# Patient Record
Sex: Female | Born: 1990 | Race: White | Hispanic: No | Marital: Single | Smoking: Never smoker
Health system: Southern US, Community
[De-identification: ages and names within clinical notes are randomized; demographics above are authoritative.]

## PROBLEM LIST (undated history)

## (undated) DIAGNOSIS — F419 Anxiety disorder, unspecified: Secondary | ICD-10-CM

---

## 2018-03-09 ENCOUNTER — Encounter: Payer: Self-pay | Admitting: Emergency Medicine

## 2018-03-09 ENCOUNTER — Emergency Department
Admission: EM | Admit: 2018-03-09 | Discharge: 2018-03-09 | Disposition: A | Discharge status: Home / Self Care | Payer: No Typology Code available for payment source | Attending: Emergency Medicine | Admitting: Emergency Medicine

## 2018-03-09 ENCOUNTER — Emergency Department: Payer: No Typology Code available for payment source

## 2018-03-09 ENCOUNTER — Other Ambulatory Visit: Payer: Self-pay

## 2018-03-09 DIAGNOSIS — S63501A Unspecified sprain of right wrist, initial encounter: Secondary | ICD-10-CM | POA: Insufficient documentation

## 2018-03-09 DIAGNOSIS — Y990 Civilian activity done for income or pay: Secondary | ICD-10-CM | POA: Diagnosis not present

## 2018-03-09 DIAGNOSIS — Y9389 Activity, other specified: Secondary | ICD-10-CM | POA: Insufficient documentation

## 2018-03-09 DIAGNOSIS — X500XXA Overexertion from strenuous movement or load, initial encounter: Secondary | ICD-10-CM | POA: Insufficient documentation

## 2018-03-09 DIAGNOSIS — Y929 Unspecified place or not applicable: Secondary | ICD-10-CM | POA: Insufficient documentation

## 2018-03-09 DIAGNOSIS — S6991XA Unspecified injury of right wrist, hand and finger(s), initial encounter: Secondary | ICD-10-CM | POA: Diagnosis present

## 2018-03-09 HISTORY — DX: Anxiety disorder, unspecified: F41.9

## 2018-03-09 MED ORDER — MELOXICAM 15 MG PO TABS: 15.0000 mg | ORAL_TABLET | Freq: Every day | ORAL | 2 refills | Status: AC

## 2018-03-09 NOTE — ED Notes (Signed)
I was placing the wrist splint on the patient and I asked if this would be a workman's comp and she stated "yes unfortunately." I went to call her supervisor to see what test needed to be done. When I returned to the room to let her know I didn't get in touch with anyone, she had already left.

## 2018-03-09 NOTE — Discharge Instructions (Signed)
Follow-up with orthopedics in 5 to 7 days.  Please discuss this with your Worker's Comp. to make sure you are seeing a orthopedic of their choice.  Otherwise she will have to pay out of pocket for your orthopedic visit.  Take the medication for pain and inflammation.  Keep the arm elevated and apply ice.

## 2018-03-09 NOTE — ED Notes (Signed)
Works for [employer].

## 2018-03-09 NOTE — ED Triage Notes (Signed)
Was lifting stair chair, felt a "pop" in right wrist this am, workers comp case.

## 2018-03-09 NOTE — ED Notes (Addendum)
See triage note  Presents with pain to right wrist/forearm  States felt the pain when lifting a stair chair  Good pulses  States this is not a wc case at this time

## 2018-03-09 NOTE — ED Provider Notes (Signed)
[facility] Emergency Department Provider Note  ____________________________________________   First MD Initiated Contact with Patient 03/09/18 1219     (approximate)  I have reviewed the triage vital signs and the nursing notes.   HISTORY  Chief Complaint Wrist Pain    HPI Kristi Norris is a 27 y.o. female presents to the emergency department complaining of right wrist and forearm pain.   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  When she lifted the chair she felt a pop and snap in her wrist.  She states she has to lift the chair underhanded.  She denies any numbness or tingling.  States she has pain with movement.  Is never had an issue with her wrist before.   Past Medical History:  Diagnosis Date  . Anxiety     There are no active problems to display for this patient.   History reviewed. No pertinent surgical history.  Prior to Admission medications   Medication Sig Start Date End Date Taking? Authorizing Provider  meloxicam (MOBIC) 15 MG tablet Take 1 tablet (15 mg total) by mouth daily. 03/09/18 03/09/19  Faythe Ghee, PA-C    Allergies Patient has no known allergies.  No family history on file.  Social History Social History   Tobacco Use  . Smoking status: Never Smoker  . Smokeless tobacco: Never Used  Substance Use Topics  . Alcohol use: Yes    Comment: occas.   . Drug use: Not on file    Review of Systems  Constitutional: No fever/chills Eyes: No visual changes. ENT: No sore throat. Respiratory: Denies cough Genitourinary: Negative for dysuria. Musculoskeletal: Negative for back pain.  Positive for right wrist pain Skin: Negative for rash.    ____________________________________________   PHYSICAL EXAM:  VITAL SIGNS: ED Triage Vitals  Enc Vitals Group     BP 03/09/18 1158 120/81     Pulse Rate 03/09/18 1158 87     Resp 03/09/18 1158 16     Temp 03/09/18 1158 98.3 F (36.8 C)   Temp Source 03/09/18 1158 Oral     SpO2 03/09/18 1158 96 %     Weight 03/09/18 1159 140 lb (63.5 kg)     Height 03/09/18 1159 5\' 2"  (1.575 m)     Head Circumference --      Peak Flow --      Pain Score 03/09/18 1159 8     Pain Loc --      Pain Edu? --      Excl. in GC? --     Constitutional: Alert and oriented. Well appearing and in no acute distress. Eyes: Conjunctivae are normal.  Head: Atraumatic. Nose: No congestion/rhinnorhea. Mouth/Throat: Mucous membranes are moist.   Neck:  supple no lymphadenopathy noted Cardiovascular: Normal rate, regular rhythm.  Respiratory: Normal respiratory effort.  No retractions, GU: deferred Musculoskeletal: FROM all extremities, warm and well perfused.  The right wrist and forearm are tender to palpation.  Pain is reproduced with dorsiflexion of the wrist.  Neurovascular is intact. Neurologic:  Normal speech and language.  Skin:  Skin is warm, dry and intact. No rash noted. Psychiatric: Mood and affect are normal. Speech and behavior are normal.  ____________________________________________   LABS (all labs ordered are listed, but only abnormal results are displayed)  Labs Reviewed - No data to display ____________________________________________   ____________________________________________  RADIOLOGY  X-ray of the right forearm is negative  ____________________________________________   PROCEDURES  Procedure(s)  performed: Velcro cock-up splint was applied by the tech  Procedures    ____________________________________________   INITIAL IMPRESSION / ASSESSMENT AND PLAN / ED COURSE  Pertinent labs & imaging results that were available during my care of the patient were reviewed by me and considered in my medical decision making (see chart for details).   Patient is [age] year old female presents emergency department complaining of right wrist pain after using a chairlift.  States she felt a pop and snap.  She states she has  pain with movement.  She denies any numbness or tingling.  On physical exam the patient is tender along the right forearm and wrist.  She has reproduce pain with dorsiflexion of the right wrist.  She is neurovascularly intact.  X-ray of the right forearm is negative for any acute abnormality.  Discussed all the findings with patient.  She is placed in a Velcro cock-up splint.  She is to follow-up with orthopedics for recheck prior to returning to work.  She is to apply ice and take meloxicam daily for pain and inflammation.  Explained to her that since this is Worker's Comp. she would be given light duty as he not using the right arm until evaluated by Ortho.  She is to follow-up with her supervisor and insurance carrier to determine which orthopedic office they will approve.  She states she understands will comply.  A work note was filled out for her to take to her supervisor.  She was discharged in stable condition.     As part of my medical decision making, I reviewed the following data within the electronic MEDICAL RECORD NUMBER Nursing notes reviewed and incorporated, Old chart reviewed, Radiograph reviewed x-ray of the right forearm is negative, Notes from prior ED visits and [place] Controlled Substance Database  ____________________________________________   FINAL CLINICAL IMPRESSION(S) / ED DIAGNOSES  Final diagnoses:  Sprain of right wrist, initial encounter      NEW MEDICATIONS STARTED DURING THIS VISIT:  Discharge Medication List as of 03/09/2018  1:01 PM    START taking these medications   Details  meloxicam (MOBIC) 15 MG tablet Take 1 tablet (15 mg total) by mouth daily., Starting Sun 03/09/2018, Until Mon 03/09/2019, Normal         Note:  This document was prepared using Dragon voice recognition software and may include unintentional dictation errors.    Faythe GheeFisher, Jhane Lorio W, PA-C 03/09/18 1529    Jene EveryKinner, Robert, MD 03/10/18 [phone]

## 2019-11-23 IMAGING — DX DG FOREARM 2V*R*
2 series · 2 of 2 positions shown · non-contrast
Comparison: None.

CLINICAL DATA: PT WAS LIFTING SOMETHING AT WORK TODAY AND FELT AND
HEARD A POP. RIGHT WRIST PAIN THAT GOES UP FOREARM. HX OF RIGHT
WRIST BREAK. UNDERWENT CONSV. TREATMENT FOR THAT. NO SX. ULNAR SIDE
WRIST PAIN. DENIES NUMBNESS AND TINGLING. DULL PAIN IN WRIST.
BURNING PAIN WITH DIRECT TOUCH.

EXAM:
RIGHT FOREARM - 2 VIEW

[forearm ap]
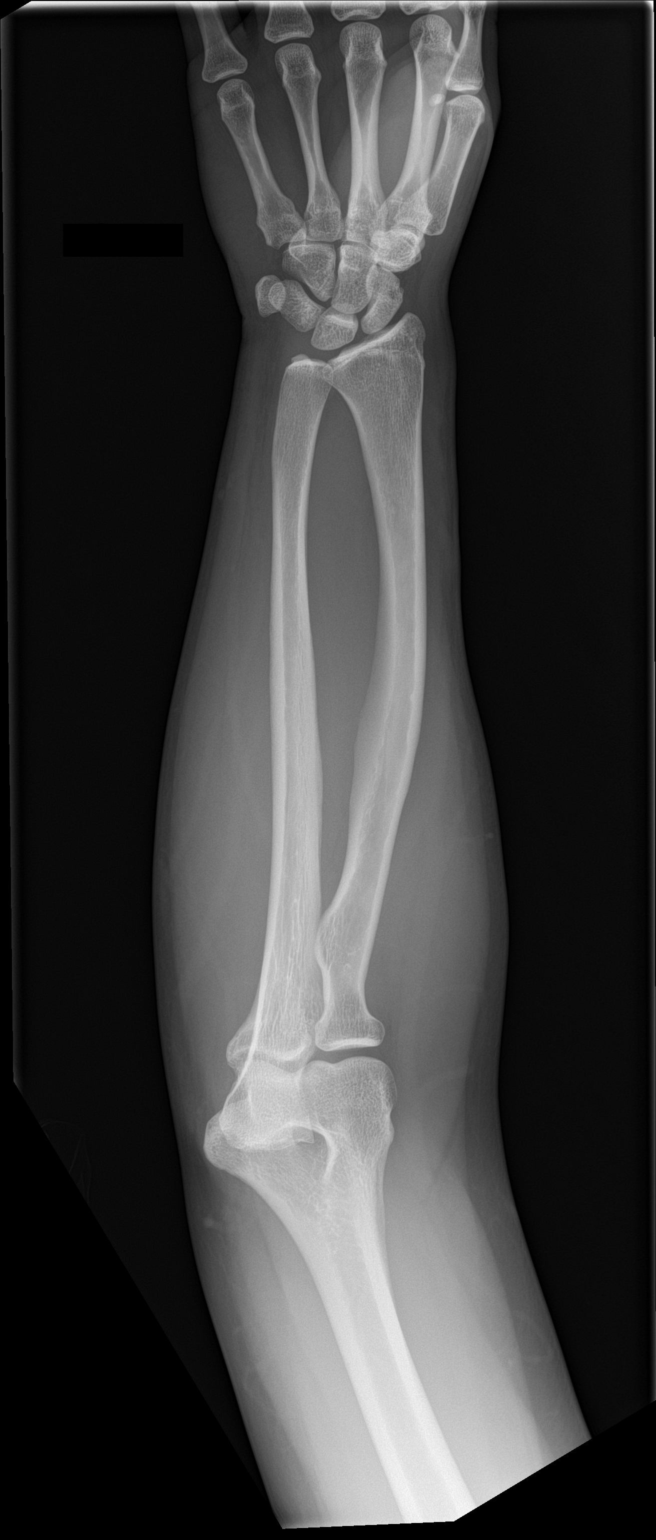

[forearm lat]
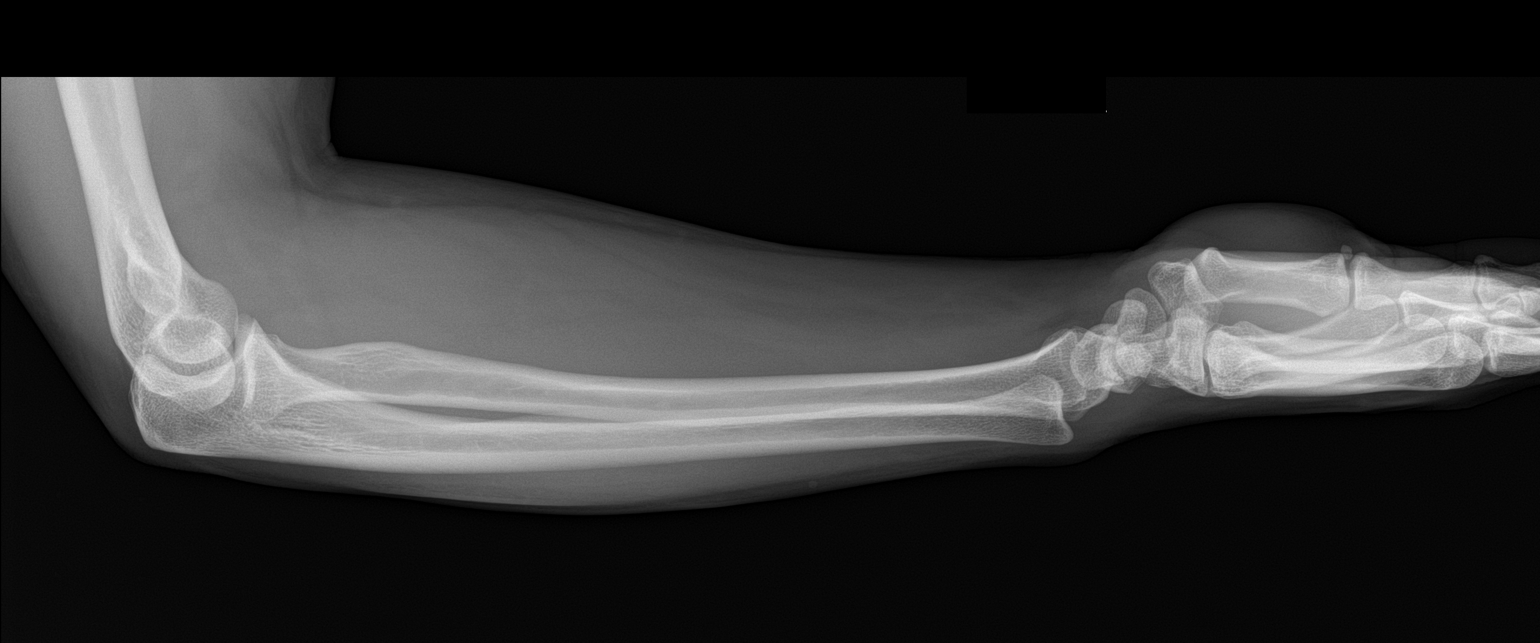

[2 of 2 positions shown; findings below may reference images not displayed]

FINDINGS: There is no evidence of fracture or other focal bone lesions. Soft
tissues are unremarkable.
IMPRESSION: Negative.
